# Patient Record
Sex: Male | Born: 1995 | Race: White | Hispanic: No | Marital: Married | Smoking: Former smoker
Health system: Southern US, Community
[De-identification: ages and names within clinical notes are randomized; demographics above are authoritative.]

## PROBLEM LIST (undated history)

## (undated) ENCOUNTER — Emergency Department (HOSPITAL_COMMUNITY): Payer: Self-pay

## (undated) DIAGNOSIS — F419 Anxiety disorder, unspecified: Secondary | ICD-10-CM

---
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
## 2018-01-01 ENCOUNTER — Other Ambulatory Visit: Payer: Self-pay

## 2018-01-01 ENCOUNTER — Emergency Department
Admission: EM | Admit: 2018-01-01 | Discharge: 2018-01-01 | Disposition: A | Discharge status: Home / Self Care | Payer: Managed Care, Other (non HMO) | Attending: Emergency Medicine | Admitting: Emergency Medicine

## 2018-01-01 ENCOUNTER — Encounter: Payer: Self-pay | Admitting: Emergency Medicine

## 2018-01-01 DIAGNOSIS — R1013 Epigastric pain: Secondary | ICD-10-CM | POA: Diagnosis present

## 2018-01-01 DIAGNOSIS — K2900 Acute gastritis without bleeding: Secondary | ICD-10-CM | POA: Insufficient documentation

## 2018-01-01 DIAGNOSIS — Z87891 Personal history of nicotine dependence: Secondary | ICD-10-CM | POA: Insufficient documentation

## 2018-01-01 NOTE — ED Triage Notes (Signed)
C/O stomach ache this morning.  Patient states he thinks it is just from drinking yesterday.  Patient states he is feeling better, but just needs a note to return to work.

## 2018-01-01 NOTE — ED Provider Notes (Signed)
[facility] Emergency Department Provider Note   ____________________________________________    I have reviewed the triage vital signs and the nursing notes.   HISTORY  Chief Complaint Abdominal Pain     HPI Dominic Thomas is a 22 y.o. male who presents with complaints of epigastric abdominal pain.  Patient reports he woke up this morning with mild to moderate sharp pains in his epigastrium.  However he reports after he took shower and ate something he started to feel significantly better.  He denies hematemesis, nausea or vomiting.  No diarrhea.  No history of abdominal surgery  History reviewed. No pertinent past medical history.  There are no active problems to display for this patient.   History reviewed. No pertinent surgical history.  Prior to Admission medications   Not on File     Allergies Patient has no known allergies.  No family history on file.  Social History Social History   Tobacco Use  . Smoking status: Former Games developermoker  . Smokeless tobacco: Never Used  Substance Use Topics  . Alcohol use: Yes  . Drug use: Not Currently    Review of Systems  Constitutional: No fever/chills Eyes: No visual changes.  ENT: No sore throat. Cardiovascular: No palpitations Respiratory: No cough Gastrointestinal: As above Genitourinary: Negative for dysuria. Musculoskeletal: Negative for back pain. Skin: Negative for rash. Neuro: No headache   ____________________________________________   PHYSICAL EXAM:  VITAL SIGNS: ED Triage Vitals  Enc Vitals Group     BP 01/01/18 0714 132/89     Pulse Rate 01/01/18 0714 91     Resp 01/01/18 0714 16     Temp 01/01/18 0714 97.9 F (36.6 C)     Temp Source 01/01/18 0714 Oral     SpO2 01/01/18 0714 97 %     Weight 01/01/18 0708 81.6 kg (180 lb)     Height 01/01/18 0708 1.727 m (5\' 8" )     Head Circumference --      Peak Flow --      Pain Score 01/01/18 0707 0     Pain Loc --    Pain Edu? --      Excl. in GC? --     Constitutional: Alert and oriented. No acute distress. Pleasant and interactive Cardiovascular: Normal rate, regular rhythm Good peripheral circulation. Respiratory: Normal respiratory effort.  No retractions.  Gastrointestinal: Soft and nontender. No distention.     Neurologic:  Normal speech and language. No gross focal neurologic deficits are appreciated.  Skin:  Skin is warm, dry and intact. No rash noted. Psychiatric: Mood and affect are normal. Speech and behavior are normal.  ____________________________________________   LABS (all labs ordered are listed, but only abnormal results are displayed)  Labs Reviewed - No data to display ____________________________________________  EKG  None ____________________________________________  RADIOLOGY  None ____________________________________________   PROCEDURES  Procedure(s) performed: No  Procedures   Critical Care performed: No ____________________________________________   INITIAL IMPRESSION / ASSESSMENT AND PLAN / ED COURSE  Pertinent labs & imaging results that were available during my care of the patient were reviewed by me and considered in my medical decision making (see chart for details).  Patient reports his symptoms have nearly resolved upon arrival to the emergency department.  He feels quite well.  Given this no indication for lab work or further work-up.  Suspect gastritis, he does admit to EtOH use last night.  Given rapid improvement not consistent with pancreatitis.    ____________________________________________  FINAL CLINICAL IMPRESSION(S) / ED DIAGNOSES  Final diagnoses:  Acute gastritis without hemorrhage, unspecified gastritis type        Note:  This document was prepared using Dragon voice recognition software and may include unintentional dictation errors.    Jene EveryKinner, Shakora Nordquist, MD 01/01/18 [phone]

## 2018-01-01 NOTE — ED Notes (Signed)
Pt states upset stomach this am, but feels better. States work needs at work note

## 2020-01-31 ENCOUNTER — Other Ambulatory Visit: Payer: Self-pay

## 2020-01-31 ENCOUNTER — Other Ambulatory Visit: Payer: Self-pay | Admitting: Physician Assistant

## 2020-01-31 ENCOUNTER — Ambulatory Visit (INDEPENDENT_AMBULATORY_CARE_PROVIDER_SITE_OTHER): Payer: Managed Care, Other (non HMO)

## 2020-01-31 DIAGNOSIS — Z021 Encounter for pre-employment examination: Secondary | ICD-10-CM

## 2020-03-10 ENCOUNTER — Other Ambulatory Visit (HOSPITAL_COMMUNITY): Payer: Self-pay | Admitting: Orthopedic Surgery

## 2020-03-10 ENCOUNTER — Other Ambulatory Visit: Payer: Self-pay | Admitting: Orthopedic Surgery

## 2020-03-10 DIAGNOSIS — M25562 Pain in left knee: Secondary | ICD-10-CM

## 2020-03-10 DIAGNOSIS — M25362 Other instability, left knee: Secondary | ICD-10-CM

## 2020-03-28 ENCOUNTER — Ambulatory Visit: Payer: BC Managed Care – PPO

## 2021-01-04 ENCOUNTER — Encounter: Payer: Self-pay | Admitting: Emergency Medicine

## 2021-01-04 ENCOUNTER — Emergency Department
Admission: EM | Admit: 2021-01-04 | Discharge: 2021-01-04 | Disposition: A | Discharge status: Home / Self Care | Payer: Self-pay | Attending: Emergency Medicine | Admitting: Emergency Medicine

## 2021-01-04 ENCOUNTER — Emergency Department: Payer: Self-pay

## 2021-01-04 ENCOUNTER — Other Ambulatory Visit: Payer: Self-pay

## 2021-01-04 DIAGNOSIS — Z87891 Personal history of nicotine dependence: Secondary | ICD-10-CM | POA: Insufficient documentation

## 2021-01-04 DIAGNOSIS — A749 Chlamydial infection, unspecified: Secondary | ICD-10-CM | POA: Insufficient documentation

## 2021-01-04 HISTORY — DX: Anxiety disorder, unspecified: F41.9

## 2021-01-04 LAB — URINALYSIS, COMPLETE (UACMP) WITH MICROSCOPIC
Bacteria, UA: NONE SEEN
Bilirubin Urine: NEGATIVE
Glucose, UA: NEGATIVE mg/dL
Hgb urine dipstick: NEGATIVE
Ketones, ur: 5 mg/dL — AB
Nitrite: NEGATIVE
Protein, ur: 30 mg/dL — AB
RBC / HPF: >50 RBC/hpf — ABNORMAL HIGH (ref 0–5)
Specific Gravity, Urine: 1.029 (ref 1.005–1.030)
Squamous Epithelial / LPF: NONE SEEN (ref 0–5)
pH: 7 (ref 5.0–8.0)

## 2021-01-04 LAB — CHLAMYDIA/NGC RT PCR (ARMC ONLY)
Chlamydia Tr: DETECTED — AB
N gonorrhoeae: NOT DETECTED

## 2021-01-04 MED ORDER — DOXYCYCLINE MONOHYDRATE 100 MG PO TABS: 100.0000 mg | ORAL_TABLET | Freq: Two times a day (BID) | ORAL | 0 refills | Status: AC

## 2021-01-04 MED ORDER — DOXYCYCLINE HYCLATE 100 MG PO TABS
100.0000 mg | ORAL_TABLET | Freq: Once | ORAL | Status: AC
  Administered 2021-01-04: 100 mg via ORAL
  Filled 2021-01-04: qty 1

## 2021-01-04 MED ORDER — IBUPROFEN 400 MG PO TABS
400.0000 mg | ORAL_TABLET | Freq: Once | ORAL | Status: AC
  Administered 2021-01-04: 400 mg via ORAL
  Filled 2021-01-04: qty 1

## 2021-01-04 NOTE — ED Provider Notes (Signed)
[facility]  ____________________________________________   Event Date/Time   First MD Initiated Contact with Patient 01/04/21 1422     (approximate)  I have reviewed the triage vital signs and the nursing notes.   HISTORY  Chief Complaint Hematuria and Back Pain    HPI Dominic Thomas is a 25 y.o. male with past medical history of anxiety who presents with flank pain.  Last week patient noticed blood in his urine and thought he may have passed what looked like tissue.  He has had some intermittent dysuria since then but no ongoing hematuria.  Then today he woke up with left flank pain/back pain.  Denies nausea vomiting.  No fevers or chills.  No history of kidney stones.  Denies any musculoskeletal exacerbating factor.         Past Medical History:  Diagnosis Date   Anxiety     There are no problems to display for this patient.   No past surgical history on file.  Prior to Admission medications   Medication Sig Start Date End Date Taking? Authorizing Provider  doxycycline (ADOXA) 100 MG tablet Take 1 tablet (100 mg total) by mouth 2 (two) times daily for 7 days. 01/04/21 01/11/21 Yes Georga Hacking, MD    Allergies Patient has no known allergies.  No family history on file.  Social History Social History   Tobacco Use   Smoking status: Former   Smokeless tobacco: Never  Substance Use Topics   Alcohol use: Yes   Drug use: Not Currently    Review of Systems   Review of Systems  Constitutional:  Negative for activity change, chills and fever.  Respiratory:  Negative for shortness of breath.   Cardiovascular:  Negative for chest pain.  Gastrointestinal:  Negative for abdominal pain, nausea and vomiting.  Genitourinary:  Positive for dysuria, flank pain and hematuria. Negative for difficulty urinating and frequency.  All other systems reviewed and are negative.  Physical Exam Updated Vital Signs BP 125/81   Pulse 76   Temp  98.6 F (37 C) (Oral)   Resp 18   Ht 5\' 8"  (1.727 m)   Wt 88.5 kg   SpO2 97%   BMI 29.65 kg/m   Physical Exam Vitals and nursing note reviewed.  Constitutional:      General: He is not in acute distress.    Appearance: Normal appearance.  HENT:     Head: Normocephalic and atraumatic.  Eyes:     General: No scleral icterus.    Conjunctiva/sclera: Conjunctivae normal.  Pulmonary:     Effort: Pulmonary effort is normal. No respiratory distress.     Breath sounds: Normal breath sounds. No wheezing.  Abdominal:     Tenderness: There is no right CVA tenderness or left CVA tenderness.  Musculoskeletal:        General: No deformity or signs of injury.     Cervical back: Normal range of motion.  Skin:    Coloration: Skin is not jaundiced or pale.  Neurological:     General: No focal deficit present.     Mental Status: He is alert and oriented to person, place, and time. Mental status is at baseline.  Psychiatric:        Mood and Affect: Mood normal.        Behavior: Behavior normal.     LABS (all labs ordered are listed, but only abnormal results are displayed)  Labs Reviewed  CHLAMYDIA/NGC RT PCR ([facility] ONLY)           -  Abnormal; Notable for the following components:      Result Value   Chlamydia Tr DETECTED (*)    All other components within normal limits  URINALYSIS, COMPLETE (UACMP) WITH MICROSCOPIC - Abnormal; Notable for the following components:   Color, Urine YELLOW (*)    APPearance HAZY (*)    Ketones, ur 5 (*)    Protein, ur 30 (*)    Leukocytes,Ua TRACE (*)    RBC / HPF >50 (*)    All other components within normal limits   ____________________________________________  EKG  M/a ____________________________________________  RADIOLOGY Ky Barban, personally viewed and evaluated these images (plain radiographs) as part of my medical decision making, as well as reviewing the written report by the radiologist.  ED MD interpretation: CT renal  study obtained and read by radiology as negative    ____________________________________________   PROCEDURES  Procedure(s) performed (including Critical Care):  Procedures   ____________________________________________   INITIAL IMPRESSION / ASSESSMENT AND PLAN / ED COURSE     25 year old male presenting with flank pain and dysuria.  His vital signs are within normal limits and he is very well-appearing.  No objective CVA tenderness on exam.  Abdomen benign.  UA with significant RBCs, trace leuks.  I do suspect renal stone.  Will obtain CT renal study given he has no prior history.  Pain control.  And also notes that he has a new sexual partner so will test for [facility] and chlamydia.  CT renal study is negative for stones.  Patient's GC committee did come back as positive for chlamydia.  We will treat with doxycycline.  Advised to refrain from intercourse until completing treatment.  Clinical Course as of 01/04/21 1709  Mon Jan 04, 2021  1704 Chlamydia Tr(!): DETECTED [KM]    Clinical Course User Index [KM] Georga Hacking, MD     ____________________________________________   FINAL CLINICAL IMPRESSION(S) / ED DIAGNOSES  Final diagnoses:  Chlamydia     ED Discharge Orders          Ordered    doxycycline (ADOXA) 100 MG tablet  2 times daily        01/04/21 1708             Note:  This document was prepared using Dragon voice recognition software and may include unintentional dictation errors.    Georga Hacking, MD 01/04/21 [phone]

## 2021-01-04 NOTE — ED Triage Notes (Signed)
Pt via POV from home. Pt c/o lower back pain that started today. Pt also c/o burning and blood in urine for about a week. Pt is A&Ox4 and NAD. Denies fever. Denies NVD.

## 2021-01-04 NOTE — ED Notes (Signed)
Pt states that he has been having s/s for a week, states he is having painful urination and has seen some blood in his urine, pt is having bliat flank pain, pain is worse on the left side according to the pt, pt also states he has never had a kidney stone and denies any fever or chills

## 2021-02-11 ENCOUNTER — Other Ambulatory Visit: Payer: Self-pay

## 2021-02-11 ENCOUNTER — Emergency Department: Payer: Self-pay

## 2021-02-11 ENCOUNTER — Emergency Department
Admission: EM | Admit: 2021-02-11 | Discharge: 2021-02-11 | Disposition: A | Discharge status: Home / Self Care | Payer: Self-pay | Attending: Emergency Medicine | Admitting: Emergency Medicine

## 2021-02-11 ENCOUNTER — Encounter: Payer: Self-pay | Admitting: Emergency Medicine

## 2021-02-11 DIAGNOSIS — N390 Urinary tract infection, site not specified: Secondary | ICD-10-CM

## 2021-02-11 DIAGNOSIS — Z87891 Personal history of nicotine dependence: Secondary | ICD-10-CM | POA: Insufficient documentation

## 2021-02-11 DIAGNOSIS — J069 Acute upper respiratory infection, unspecified: Secondary | ICD-10-CM

## 2021-02-11 DIAGNOSIS — Z20822 Contact with and (suspected) exposure to covid-19: Secondary | ICD-10-CM | POA: Insufficient documentation

## 2021-02-11 LAB — URINALYSIS, MICROSCOPIC (REFLEX): WBC, UA: >50 WBC/hpf (ref 0–5)

## 2021-02-11 LAB — URINE CULTURE: Culture: NO GROWTH

## 2021-02-11 LAB — GROUP A STREP BY PCR: Group A Strep by PCR: NOT DETECTED

## 2021-02-11 LAB — RESP PANEL BY RT-PCR (FLU A&B, COVID) ARPGX2
Influenza A by PCR: NEGATIVE
Influenza B by PCR: NEGATIVE
SARS Coronavirus 2 by RT PCR: NEGATIVE

## 2021-02-11 LAB — URINALYSIS, ROUTINE W REFLEX MICROSCOPIC
Bilirubin Urine: NEGATIVE
Glucose, UA: NEGATIVE mg/dL
Ketones, ur: NEGATIVE mg/dL
Nitrite: NEGATIVE
Protein, ur: 30 mg/dL — AB
Specific Gravity, Urine: >1.03 — ABNORMAL HIGH (ref 1.005–1.030)
pH: 6 (ref 5.0–8.0)

## 2021-02-11 LAB — CHLAMYDIA/NGC RT PCR (ARMC ONLY)
Chlamydia Tr: NOT DETECTED
N gonorrhoeae: NOT DETECTED

## 2021-02-11 LAB — RPR: RPR Ser Ql: NONREACTIVE

## 2021-02-11 LAB — HIV ANTIBODY (ROUTINE TESTING W REFLEX): HIV Screen 4th Generation wRfx: NONREACTIVE

## 2021-02-11 MED ORDER — CEPHALEXIN 500 MG PO CAPS: 500.0000 mg | ORAL_CAPSULE | Freq: Two times a day (BID) | ORAL | 0 refills | Status: DC

## 2021-02-11 MED ORDER — CEPHALEXIN 500 MG PO CAPS
500.0000 mg | ORAL_CAPSULE | Freq: Once | ORAL | Status: AC
  Administered 2021-02-11: 500 mg via ORAL
  Filled 2021-02-11: qty 1

## 2021-02-11 NOTE — Discharge Instructions (Addendum)
You may alternate Tylenol 1000 mg every 6 hours as needed for pain, fever and Ibuprofen 800 mg every 8 hours as needed for pain, fever.  Please take Ibuprofen with food.  Do not take more than 4000 mg of Tylenol (acetaminophen) in a 24 hour period.  

## 2021-02-11 NOTE — ED Provider Notes (Signed)
[facility] Emergency Department Provider Note  ____________________________________________   Event Date/Time   First MD Initiated Contact with Patient 02/11/21 0132     (approximate)  I have reviewed the triage vital signs and the nursing notes.   HISTORY  Chief Complaint Cough    HPI Dominic Thomas is a 25 y.o. male with history of anxiety who presents to the emergency department with complaints of 2 days of sore throat, cough, feeling dizzy, chest pain from coughing.  States symptoms started with his 66-year-old and wife having the same symptoms at home and states now his 12-week-old is sick and in the PICU at [facility] on oxygen and was diagnosed with RSV.  He denies that he has had any fevers.  Taking over-the-counter medications with some relief.  Also reports that he started having urinary frequency without dysuria, hematuria yesterday.  No penile discharge, testicular pain, scrotal swelling.  No abdominal pain, back pain.  No nausea, vomiting or diarrhea.  Was just treated for chlamydia about a month ago.  States he completed his course of doxycycline.  States he has 2 male sexual partners that were both tested and treated.         Past Medical History:  Diagnosis Date   Anxiety     There are no problems to display for this patient.   History reviewed. No pertinent surgical history.  Prior to Admission medications   Medication Sig Start Date End Date Taking? Authorizing Provider  cephALEXin (KEFLEX) 500 MG capsule Take 1 capsule (500 mg total) by mouth 2 (two) times daily. 02/11/21  Yes Ninoshka Wainwright, Layla Maw, DO    Allergies Patient has no known allergies.  No family history on file.  Social History Social History   Tobacco Use   Smoking status: Former   Smokeless tobacco: Never  Building services engineer Use: Never used  Substance Use Topics   Alcohol use: Yes   Drug use: Not Currently    Review of Systems Constitutional: No  fever. Eyes: No visual changes. ENT: + sore throat. Cardiovascular: Chest pain with coughing Respiratory: Denies shortness of breath. Gastrointestinal: No nausea, vomiting, diarrhea. Genitourinary: Negative for dysuria. Musculoskeletal: Negative for back pain. Skin: Negative for rash. Neurological: Negative for focal weakness or numbness.  ____________________________________________   PHYSICAL EXAM:  VITAL SIGNS: ED Triage Vitals  Enc Vitals Group     BP 02/11/21 0031 (!) 138/98     Pulse Rate 02/11/21 0031 82     Resp 02/11/21 0031 18     Temp --      Temp Source 02/11/21 0031 Oral     SpO2 02/11/21 0031 98 %     Weight 02/11/21 0030 200 lb (90.7 kg)     Height 02/11/21 0030 5\' 6"  (1.676 m)     Head Circumference --      Peak Flow --      Pain Score 02/11/21 0029 4     Pain Loc --      Pain Edu? --      Excl. in GC? --    CONSTITUTIONAL: Alert and oriented and responds appropriately to questions. Well-appearing; well-nourished, nontoxic appearing HEAD: Normocephalic EYES: Conjunctivae clear, pupils appear equal, EOM appear intact ENT: normal nose; moist mucous membranes, mild pharyngeal erythema without tonsillar hypertrophy or exudate, normal phonation, no trismus or drooling, no stridor NECK: Supple, normal ROM, no meningismus CARD: RRR; S1 and S2 appreciated; no murmurs, no clicks, no rubs, no gallops RESP:  Normal chest excursion without splinting or tachypnea; breath sounds clear and equal bilaterally; no wheezes, no rhonchi, no rales, no hypoxia or respiratory distress, speaking full sentences ABD/GI: Normal bowel sounds; non-distended; soft, non-tender, no rebound, no guarding, no peritoneal signs, no hepatosplenomegaly BACK: The back appears normal, no CVA tenderness EXT: Normal ROM in all joints; no deformity noted, no edema; no cyanosis SKIN: Normal color for age and race; warm; no rash on exposed skin NEURO: Moves all extremities equally PSYCH: The patient's  mood and manner are appropriate.  ____________________________________________   LABS (all labs ordered are listed, but only abnormal results are displayed)  Labs Reviewed  URINALYSIS, ROUTINE W REFLEX MICROSCOPIC - Abnormal; Notable for the following components:      Result Value   Specific Gravity, Urine >1.030 (*)    Hgb urine dipstick LARGE (*)    Protein, ur 30 (*)    Leukocytes,Ua SMALL (*)    All other components within normal limits  URINALYSIS, MICROSCOPIC (REFLEX) - Abnormal; Notable for the following components:   Bacteria, UA RARE (*)    All other components within normal limits  GROUP A STREP BY PCR  RESP PANEL BY RT-PCR (FLU A&B, COVID) [id number]  CHLAMYDIA/NGC RT PCR ([facility] ONLY)            URINE CULTURE  HIV ANTIBODY (ROUTINE TESTING W REFLEX)  RPR   ____________________________________________  EKG   ____________________________________________  RADIOLOGY I, Della Homan, personally viewed and evaluated these images (plain radiographs) as part of my medical decision making, as well as reviewing the written report by the radiologist.  ED MD interpretation: Chest x-ray clear.  Official radiology report(s): DG Chest 2 View  Result Date: 02/11/2021 CLINICAL DATA:  Cough EXAM: CHEST - 2 VIEW COMPARISON:  01/31/2020 FINDINGS: Lungs are clear.  No pleural effusion or pneumothorax. The heart is normal in size. Visualized osseous structures are within normal limits. IMPRESSION: Normal chest radiographs. Electronically Signed   By: Charline Bills M.D.   On: 02/11/2021 01:50    ____________________________________________   PROCEDURES  Procedure(s) performed (including Critical Care):  Procedures    ____________________________________________   INITIAL IMPRESSION / ASSESSMENT AND PLAN / ED COURSE  As part of my medical decision making, I reviewed the following data within the electronic MEDICAL RECORD NUMBER Nursing notes reviewed and incorporated, Labs  reviewed , Old chart reviewed, and Notes from prior ED visits         Patient here with symptoms of viral URI.  He states his child just tested positive for RSV and is at [facility].  Strep, COVID, flu negative today.  Chest x-ray clear.  Discussed with patient that I suspect that he also has RSV which is a viral illness and does not require antibiotics.  He has no hypoxia, increased work of breathing and is well-hydrated, nontoxic.  Discussed supportive care instructions.  Does have some mild chest discomfort only with coughing.  Doubt ACS, PE or dissection.  Likely musculoskeletal in nature.  Doubt rib fracture.  Have offered him cough medication which he declines.  Patient also states that he has had some urinary frequency.  Urine shows a large amount of white blood cells.  We will add on urine culture.  He denies any abdominal pain, flank pain, vomiting.  Gonorrhea, chlamydia are negative today.  RPR, HIV pending.  He can follow-up on these results through MyChart.  Discussed with him if he is positive for any STDs he will be contacted.  Will  discharge on Keflex.  Doubt kidney stone, appendicitis.  At this time, I do not feel there is any life-threatening condition present. I have reviewed, interpreted and discussed all results (EKG, imaging, lab, urine as appropriate) and exam findings with patient/family. I have reviewed nursing notes and appropriate previous records.  I feel the patient is safe to be discharged home without further emergent workup and can continue workup as an outpatient as needed. Discussed usual and customary return precautions. Patient/family verbalize understanding and are comfortable with this plan.  Outpatient follow-up has been provided as needed. All questions have been answered.  ____________________________________________   FINAL CLINICAL IMPRESSION(S) / ED DIAGNOSES  Final diagnoses:  Viral URI with cough  Acute UTI     ED Discharge Orders           Ordered    cephALEXin (KEFLEX) 500 MG capsule  2 times daily        02/11/21 0239            *Please note:  ROGELIO WAYNICK was evaluated in Emergency Department on 02/11/2021 for the symptoms described in the history of present illness. He was evaluated in the context of the global COVID-19 pandemic, which necessitated consideration that the patient might be at risk for infection with the SARS-CoV-2 virus that causes COVID-19. Institutional protocols and algorithms that pertain to the evaluation of patients at risk for COVID-19 are in a state of rapid change based on information released by regulatory bodies including the CDC and federal and state organizations. These policies and algorithms were followed during the patient's care in the ED.  Some ED evaluations and interventions may be delayed as a result of limited staffing during and the pandemic.*   Note:  This document was prepared using Dragon voice recognition software and may include unintentional dictation errors.    Krystal Delduca, Layla Maw, DO 02/11/21 [phone]

## 2021-02-11 NOTE — ED Triage Notes (Signed)
Patient ambulatory to triage with steady gait, without difficulty or distress noted; pt reports prod cough yellow sputum since yesterday with sore throat and nasal congestion; st he has sick child at home and his other child is in NICU; pt also reports that he and his wife have recently broken up and would like to be checked for STDs but denies any symptoms

## 2021-06-07 ENCOUNTER — Other Ambulatory Visit: Payer: Self-pay

## 2021-06-07 ENCOUNTER — Encounter: Payer: Self-pay | Admitting: Emergency Medicine

## 2021-06-07 ENCOUNTER — Emergency Department
Admission: EM | Admit: 2021-06-07 | Discharge: 2021-06-07 | Disposition: A | Discharge status: Home / Self Care | Payer: Self-pay | Attending: Emergency Medicine | Admitting: Emergency Medicine

## 2021-06-07 DIAGNOSIS — U071 COVID-19: Secondary | ICD-10-CM | POA: Insufficient documentation

## 2021-06-07 LAB — RESP PANEL BY RT-PCR (FLU A&B, COVID) ARPGX2
Influenza A by PCR: NEGATIVE
Influenza B by PCR: NEGATIVE
SARS Coronavirus 2 by RT PCR: POSITIVE — AB

## 2021-06-07 NOTE — Discharge Instructions (Addendum)
Call your primary care provider if any continued problems or concerns.  Return to the emergency department immediately if you develop any shortness of breath or difficulty breathing.  Increase fluids to stay hydrated.  Tylenol or ibuprofen as needed for body aches, headache or fever.  A note was written for you to remain out of work until 06/15/2021 as you have not been vaccinated and symptoms started on Saturday or Sunday morning.

## 2021-06-07 NOTE — ED Provider Notes (Signed)
[facility] Provider Note    Event Date/Time   First MD Initiated Contact with Patient 06/07/21 [phone]     (approximate)   History   doctors note   HPI  Dominic Thomas is a 26 y.o. male presents to the ED with history of positive COVID test yesterday.  Patient states that one of his children was positive for COVID last week and he began feeling ill Saturday night/Sunday morning.  He took a home test yesterday which was positive.  He states that his work is requesting a note to verify his positive COVID test.  Patient denies any difficulty breathing, fever, chills, nausea or vomiting.  Patient denies use of cigarettes at this time and has a medical history of anxiety.     Physical Exam   Triage Vital Signs: ED Triage Vitals  Enc Vitals Group     BP 06/07/21 0752 (!) 121/92     Pulse Rate 06/07/21 0752 100     Resp 06/07/21 0752 16     Temp 06/07/21 0752 98.9 F (37.2 C)     Temp Source 06/07/21 0752 Oral     SpO2 06/07/21 0752 97 %     Weight 06/07/21 0747 199 lb 15.3 oz (90.7 kg)     Height 06/07/21 0747 5\' 6"  (1.676 m)     Head Circumference --      Peak Flow --      Pain Score 06/07/21 0747 0     Pain Loc --      Pain Edu? --      Excl. in [place]? --     Most recent vital signs: Vitals:   06/07/21 0752  BP: (!) 121/92  Pulse: 100  Resp: 16  Temp: 98.9 F (37.2 C)  SpO2: 97%     General: Awake, no distress.  CV:  Good peripheral perfusion.  Heart regular rate and rhythm without murmur. Resp:  Normal effort.  Clear bilaterally. Abd:  No distention.  Other:  Ambulatory without any assistance.   ED Results / Procedures / Treatments   Labs (all labs ordered are listed, but only abnormal results are displayed) Labs Reviewed  RESP PANEL BY RT-PCR (FLU A&B, COVID) [id number] - Abnormal; Notable for the following components:      Result Value   SARS Coronavirus 2 by RT PCR POSITIVE (*)    All other components within normal limits      PROCEDURES:  Critical Care performed: No  Procedures   MEDICATIONS ORDERED IN ED: Medications - No data to display   IMPRESSION / MDM / ASSESSMENT AND PLAN / ED COURSE  I reviewed the triage vital signs and the nursing notes.    Differential diagnosis includes, but is not limited to, COVID, influenza, viral illness.   26 year old male presents to the ED with need for a work note as he tested positive yesterday at home for COVID.  One of his children was positive last week and patient began feeling ill over the weekend.  Test results here in the emergency department was reviewed by myself and was positive for COVID, negative for influenza.  Patient was given a note to remain out of work for 10 days from the onset of symptoms as he is not vaccinated.  He is aware that he needs to return to the emergency department if any severe worsening of his symptoms such as difficulty breathing or shortness of breath.   FINAL CLINICAL IMPRESSION(S) / ED DIAGNOSES  Final diagnoses:  COVID     Rx / DC Orders   ED Discharge Orders     None        Note:  This document was prepared using Dragon voice recognition software and may include unintentional dictation errors.   Tommi Rumps, PA-C 06/07/21 1541    Concha Se, MD 06/08/21 [phone]

## 2021-06-07 NOTE — ED Notes (Signed)
Reports testing positive for covid at home.  Needs a note for work.

## 2021-06-07 NOTE — ED Triage Notes (Signed)
STates tested positive for COVID yesterday.  Arrives for work excuse.  AAOx3.  Skin warm and dry. NAD

## 2021-11-02 ENCOUNTER — Emergency Department
Admission: EM | Admit: 2021-11-02 | Discharge: 2021-11-02 | Disposition: A | Discharge status: Home / Self Care | Payer: Self-pay | Attending: Emergency Medicine | Admitting: Emergency Medicine

## 2021-11-02 ENCOUNTER — Other Ambulatory Visit: Payer: Self-pay

## 2021-11-02 DIAGNOSIS — J029 Acute pharyngitis, unspecified: Secondary | ICD-10-CM | POA: Insufficient documentation

## 2021-11-02 LAB — GROUP A STREP BY PCR: Group A Strep by PCR: NOT DETECTED

## 2021-11-02 MED ORDER — PANTOPRAZOLE SODIUM 20 MG PO TBEC: 20.0000 mg | DELAYED_RELEASE_TABLET | Freq: Every day | ORAL | 1 refills | Status: AC

## 2021-11-02 MED ORDER — AMOXICILLIN 500 MG PO CAPS: 500.0000 mg | ORAL_CAPSULE | Freq: Three times a day (TID) | ORAL | 0 refills | Status: AC

## 2021-11-02 NOTE — ED Provider Notes (Signed)
[facility] Provider Note    Event Date/Time   First MD Initiated Contact with Patient 11/02/21 2107     (approximate)   History   Sore Throat   HPI  Dominic Thomas is a 26 y.o. male presents emergency department complaint of sore throat.  No pertinent past medical history.  States symptoms been ongoing for 1 week.  States he does have seasonal allergies and has some drainage but the throat is worse in the mornings and at night.  Denies any fever or chills.  No vomiting or diarrhea.      Physical Exam   Triage Vital Signs: ED Triage Vitals  Enc Vitals Group     BP 11/02/21 1957 (!) 141/93     Pulse Rate 11/02/21 1957 87     Resp 11/02/21 1957 18     Temp 11/02/21 1957 98.6 F (37 C)     Temp Source 11/02/21 1957 Oral     SpO2 11/02/21 1957 98 %     Weight 11/02/21 1955 210 lb (95.3 kg)     Height 11/02/21 1955 5\' 9"  (1.753 m)     Head Circumference --      Peak Flow --      Pain Score 11/02/21 1956 2     Pain Loc --      Pain Edu? --      Excl. in GC? --     Most recent vital signs: Vitals:   11/02/21 1957  BP: (!) 141/93  Pulse: 87  Resp: 18  Temp: 98.6 F (37 C)  SpO2: 98%     General: Awake, no distress.   CV:  Good peripheral perfusion. regular rate and  rhythm Resp:  Normal effort. Lungs CTA Abd:  No distention.   Other:  Throat is red, no exudate noted, no ulceration   ED Results / Procedures / Treatments   Labs (all labs ordered are listed, but only abnormal results are displayed) Labs Reviewed  GROUP A STREP BY PCR     EKG     RADIOLOGY     PROCEDURES:   Procedures   MEDICATIONS ORDERED IN ED: Medications - No data to display   IMPRESSION / MDM / ASSESSMENT AND PLAN / ED COURSE  I reviewed the triage vital signs and the nursing notes.                              Differential diagnosis includes, but is not limited to, strep throat, pharyngitis, reflux, seasonal allergies  Patient's  presentation is most consistent with acute complicated illness / injury requiring diagnostic workup.   Strep test negative  I did explain findings to the patient.  He is concerned he sees been in the male hospital with a small child's had pneumonia.  I did encourage him to start back on his allergy medications and trying to take it with time of day that he can remember.  He was also given prescription for amoxicillin and for Protonix.  Patient does have some heartburn type symptoms and feel that we could treat this with this medication.  He is to follow-up with his regular doctor if not improving 3 days.  Return if worsening.  Discharged stable condition.      FINAL CLINICAL IMPRESSION(S) / ED DIAGNOSES   Final diagnoses:  Sore throat     Rx / DC Orders   ED Discharge Orders  Ordered    amoxicillin (AMOXIL) 500 MG capsule  3 times daily        11/02/21 2117    pantoprazole (PROTONIX) 20 MG tablet  Daily        11/02/21 2117             Note:  This document was prepared using Dragon voice recognition software and may include unintentional dictation errors.    Faythe Ghee, PA-C 11/02/21 2120    Minna Antis, MD 11/02/21 2255

## 2021-11-02 NOTE — Discharge Instructions (Signed)
Follow-up with your regular doctor if not improving 3 days.  Return emergency department worsening.  Take medication as prescribed. 

## 2021-11-02 NOTE — ED Triage Notes (Addendum)
Pt reports sore throat x wk; denies any other accomp symptoms

## 2022-03-16 ENCOUNTER — Encounter: Payer: Self-pay | Admitting: Emergency Medicine

## 2022-03-16 ENCOUNTER — Emergency Department
Admission: EM | Admit: 2022-03-16 | Discharge: 2022-03-16 | Disposition: A | Discharge status: Home / Self Care | Payer: Self-pay | Attending: Emergency Medicine | Admitting: Emergency Medicine

## 2022-03-16 DIAGNOSIS — X58XXXA Exposure to other specified factors, initial encounter: Secondary | ICD-10-CM | POA: Insufficient documentation

## 2022-03-16 DIAGNOSIS — S00412A Abrasion of left ear, initial encounter: Secondary | ICD-10-CM | POA: Insufficient documentation

## 2022-03-16 NOTE — ED Provider Notes (Signed)
   [facility] Provider Note    Event Date/Time   First MD Initiated Contact with Patient 03/16/22 1959     (approximate)   History   Otalgia   HPI  Dominic Thomas is a 26 y.o. male with no significant past medical history presents emergency department stating he saw blood on the Q-tip after cleaning his left ear with a Q-tip.  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  No pain in the ear, no clear drainage.      Physical Exam   Triage Vital Signs: ED Triage Vitals [03/16/22 1912]  Enc Vitals Group     BP (!) 141/92     Pulse Rate 93     Resp 16     Temp 98.3 F (36.8 C)     Temp Source Oral     SpO2 98 %     Weight      Height      Head Circumference      Peak Flow      Pain Score      Pain Loc      Pain Edu?      Excl. in [place]?     Most recent vital signs: Vitals:   03/16/22 1912  BP: (!) 141/92  Pulse: 93  Resp: 16  Temp: 98.3 F (36.8 C)  SpO2: 98%     General: Awake, no distress.   CV:  Good peripheral perfusion. regular rate and  rhythm Resp:  Normal effort.  Abd:  No distention.   Other:  Left ear canal with small abrasion noted, TM is intact and clear, right ear canal is normal, right TM minimal dullness, neck is supple, no lymphadenopathy noted   ED Results / Procedures / Treatments   Labs (all labs ordered are listed, but only abnormal results are displayed) Labs Reviewed - No data to display   EKG     RADIOLOGY     PROCEDURES:   Procedures   MEDICATIONS ORDERED IN ED: Medications - No data to display   IMPRESSION / MDM / [place] / ED COURSE  I reviewed the triage vital signs and the nursing notes.                              Differential diagnosis includes, but is not limited to, abrasion, TM perforation, cerumen impaction  Patient's presentation is most consistent with acute, uncomplicated illness.   Patient's exam is consistent with  a abrasion in the ear canal.  Patient is to follow-up with his regular doctor as needed.  He may continue with his diet this weekend as the abrasion should not affect his eardrum.  Did explain all of this to the patient.  He is discharged stable condition.      FINAL CLINICAL IMPRESSION(S) / ED DIAGNOSES   Final diagnoses:  Abrasion of left ear canal, initial encounter     Rx / DC Orders   ED Discharge Orders     None        Note:  This document was prepared using Dragon voice recognition software and may include unintentional dictation errors.    Versie Starks, PA-C 03/16/22 2011    Harvest Dark, MD 03/16/22 2219

## 2022-03-16 NOTE — ED Triage Notes (Signed)
Pt c/o left ear pain and scant blood after cleaning ear with Q-Tip. 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 Pt denies drainage, fullness as well as denies applying any medication.

## 2022-03-16 NOTE — Discharge Instructions (Signed)
Your ear drum is intact and is normal, the ear canal has a small abrasion, this will heal on its own Return if worsening If ears feel congested use flonase nasal spray

## 2022-05-22 IMAGING — DX DG CHEST 1V
1 series · 1 of 1 positions shown · non-contrast
Comparison: None.

CLINICAL DATA: Employment physical

EXAM:
CHEST  1 VIEW

[chest pa]
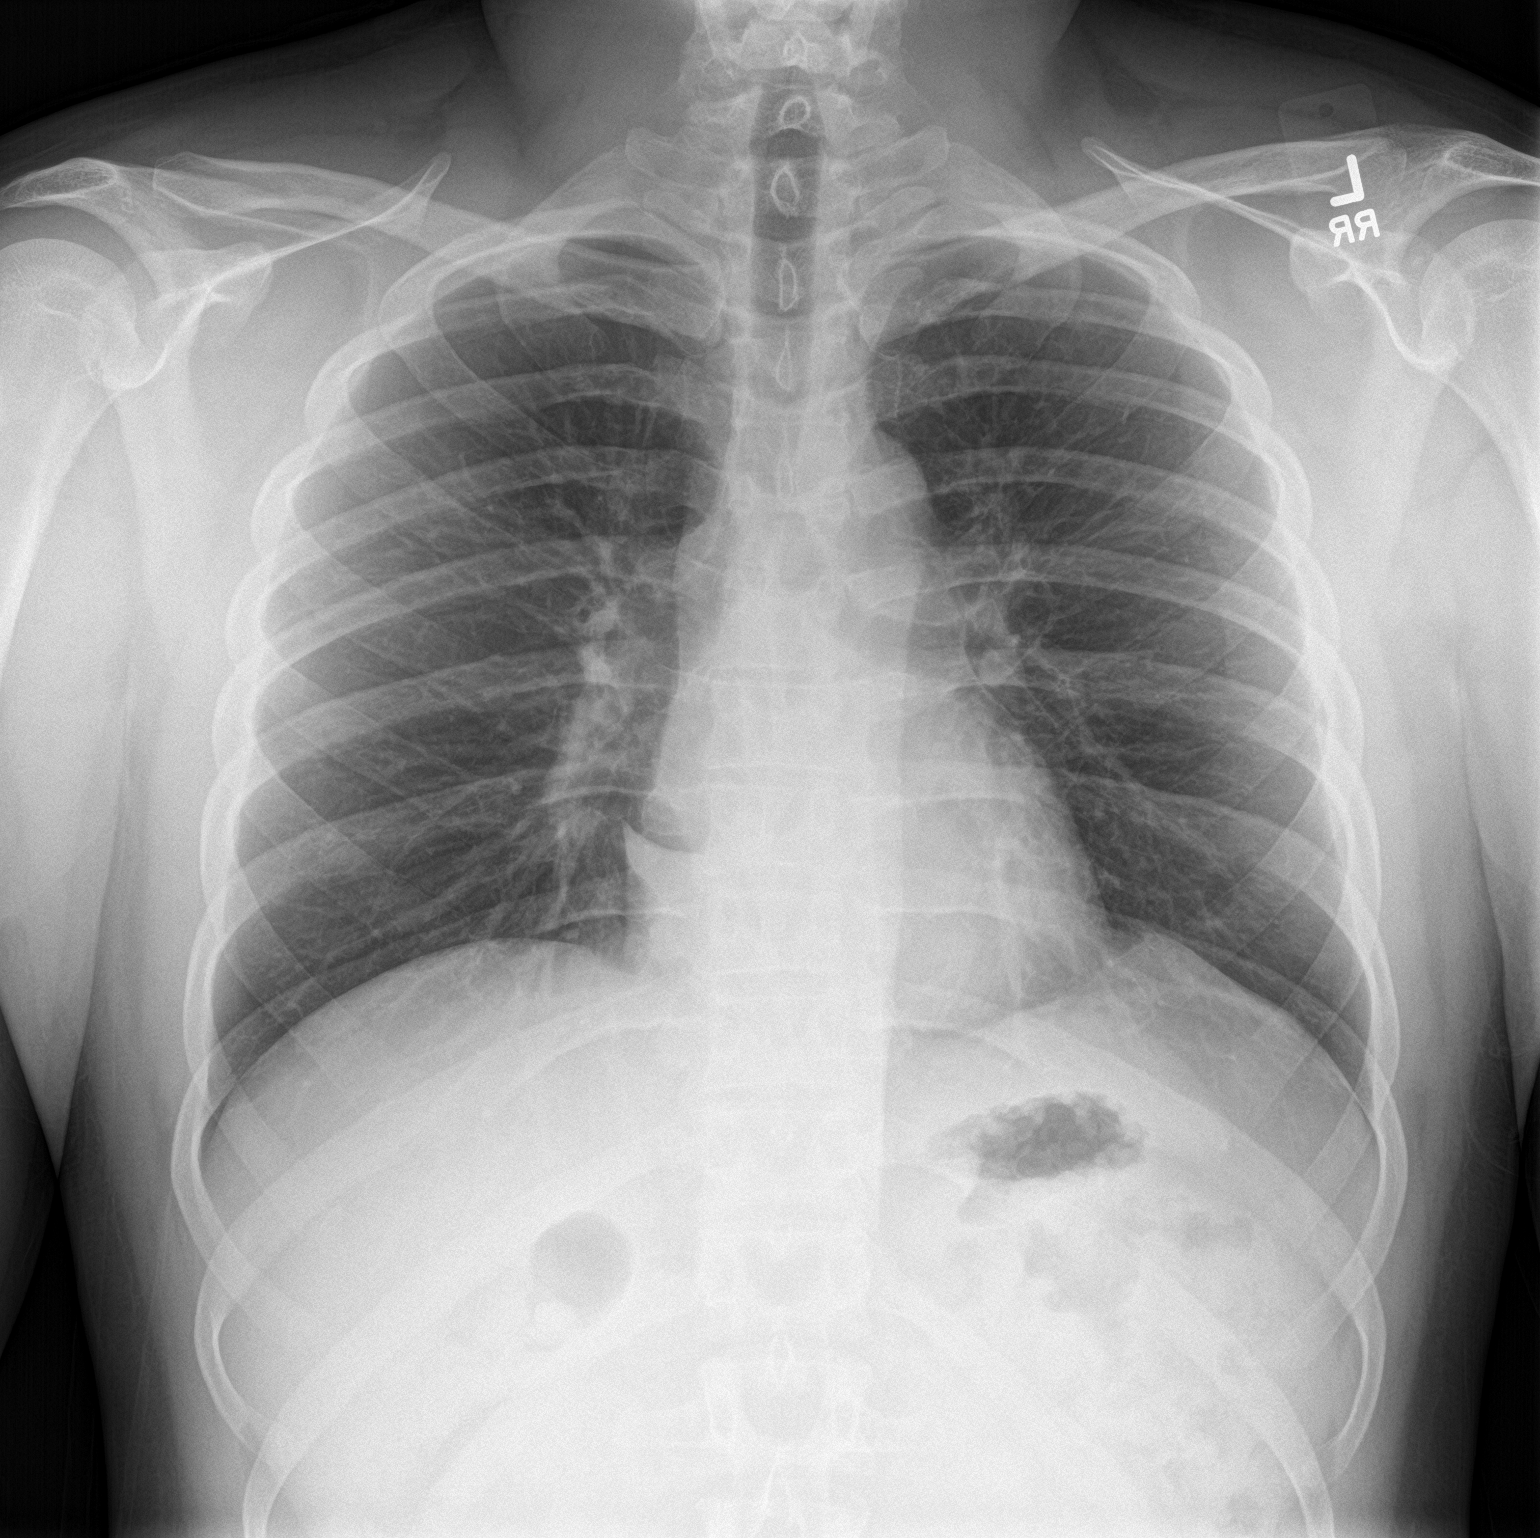

[1 of 1 positions shown; findings below may reference images not displayed]

FINDINGS: Heart and mediastinal contours are within normal limits. No focal
opacities or effusions. No acute bony abnormality.
IMPRESSION: No active disease.

## 2023-06-03 IMAGING — CR DG CHEST 2V
1 series · 2 of 2 positions shown · non-contrast
Comparison: 01/31/2020

CLINICAL DATA: Cough

EXAM:
CHEST - 2 VIEW

[Series 1: dg chest 2 view · 0.14mm/px · 2 of 2 slices shown]
[im 1/2]
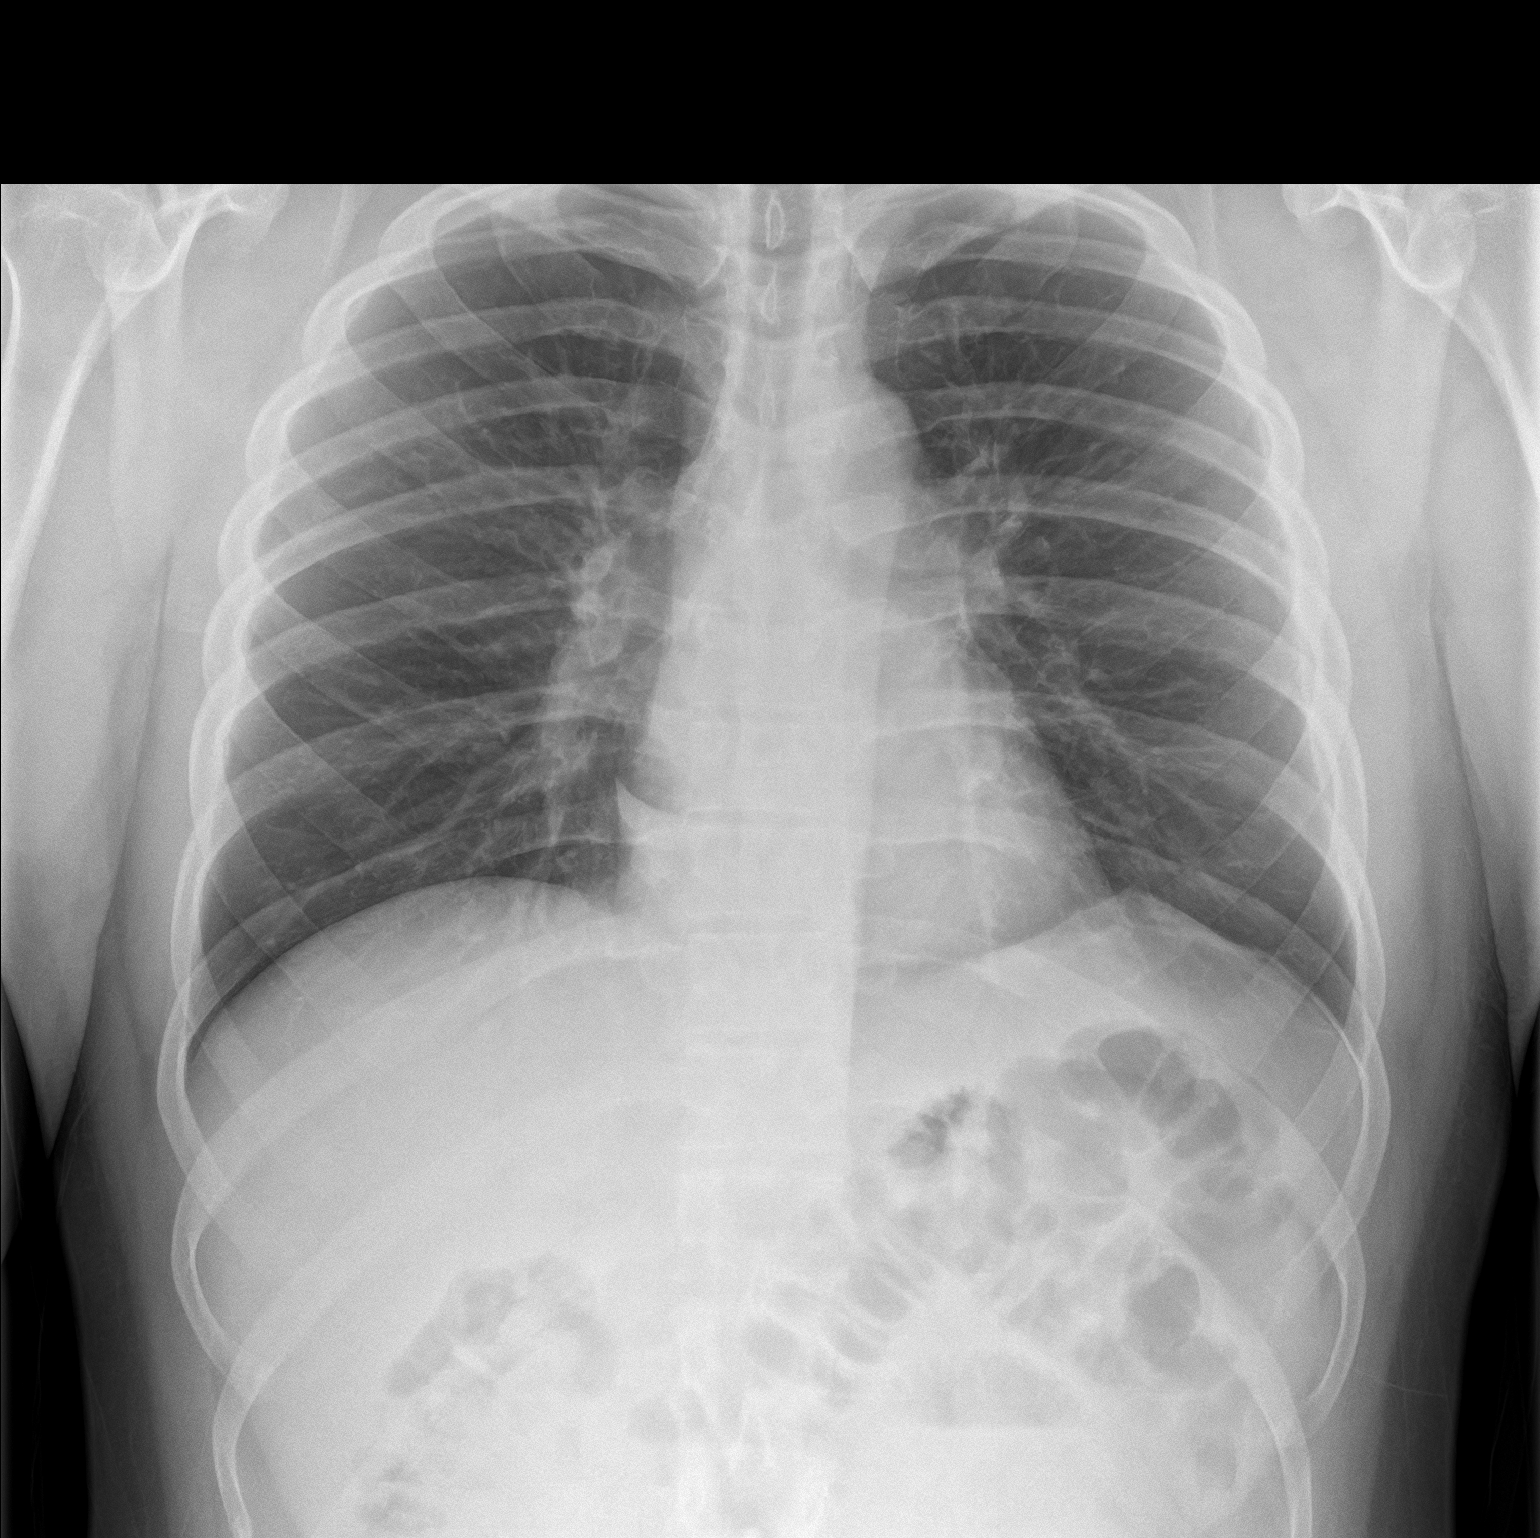
[im 2/2]
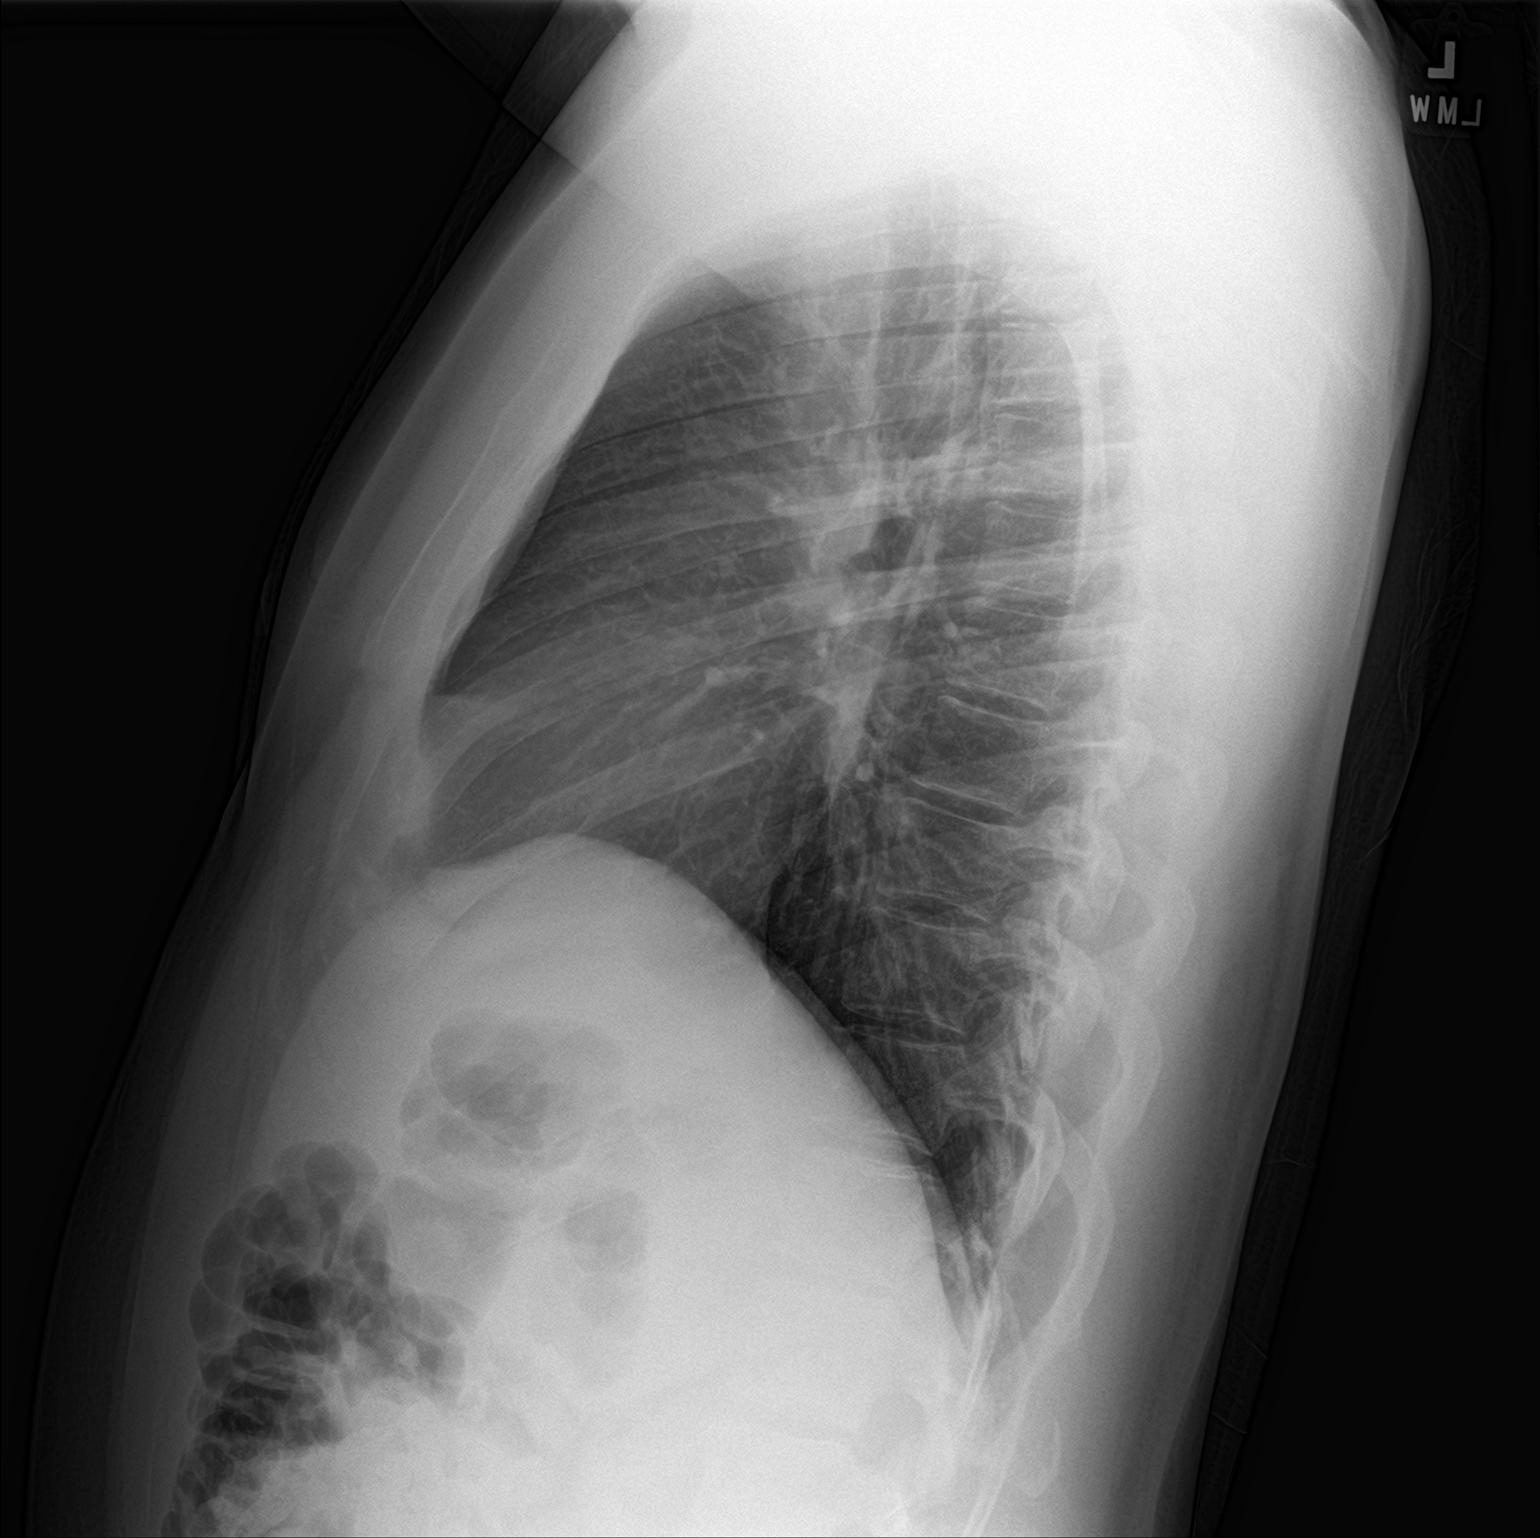

[2 of 2 positions shown; findings below may reference images not displayed]

FINDINGS: Lungs are clear.  No pleural effusion or pneumothorax.

The heart is normal in size.

Visualized osseous structures are within normal limits.
IMPRESSION: Normal chest radiographs.
